# Patient Record
Sex: Male | Born: 1980 | Race: White | Hispanic: No | Marital: Married | Smoking: Never smoker
Health system: Southern US, Community
[De-identification: ages and names within clinical notes are randomized; demographics above are authoritative.]

---

## 2005-08-14 ENCOUNTER — Emergency Department: Payer: Self-pay | Admitting: Emergency Medicine

## 2014-06-20 ENCOUNTER — Emergency Department: Payer: Self-pay | Admitting: Emergency Medicine

## 2016-02-19 IMAGING — CR RIGHT RING FINGER 2+V
1 series · 3 of 3 positions shown · non-contrast
Comparison: None.

CLINICAL DATA: Laceration to the base of the right ring finger.
Tripped and fall injury.

EXAM:
RIGHT RING FINGER 2+V

[Series 1: dxr finger ring 4th digit rt han · 0.14mm/px · 3 of 3 slices shown]
[im 1/3]
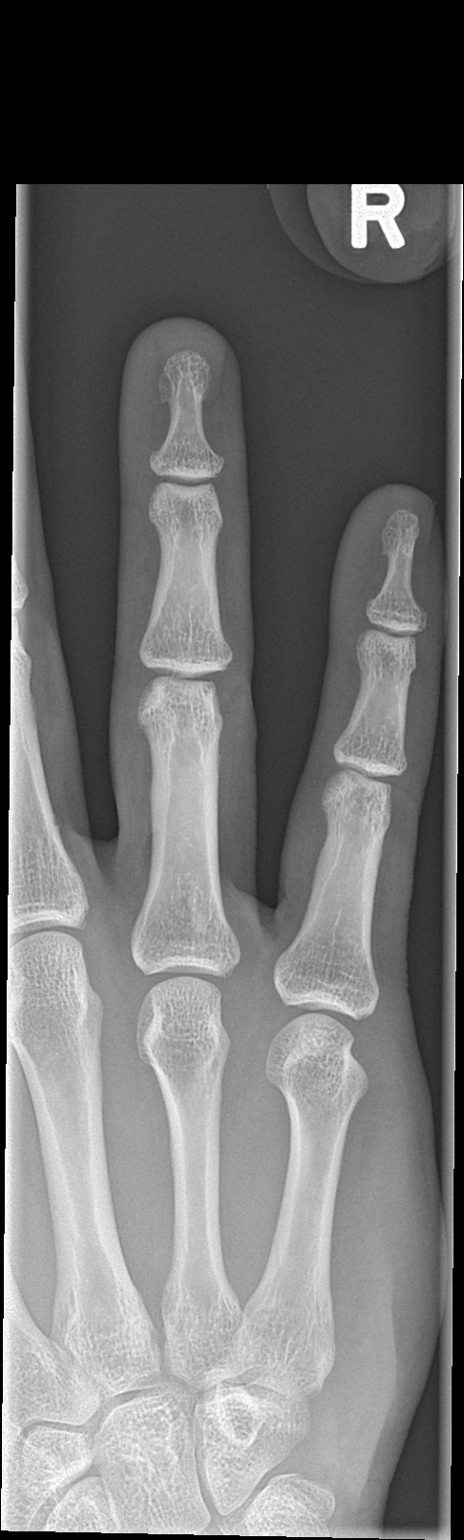
[im 2/3]
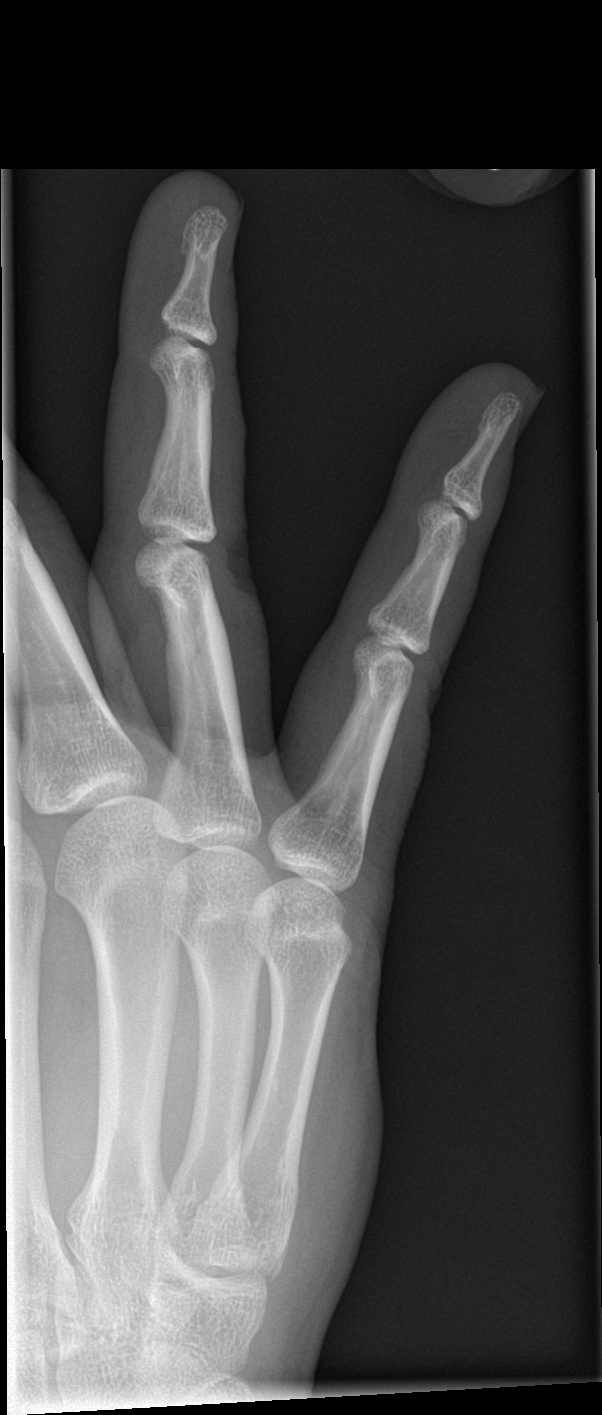
[im 3/3]
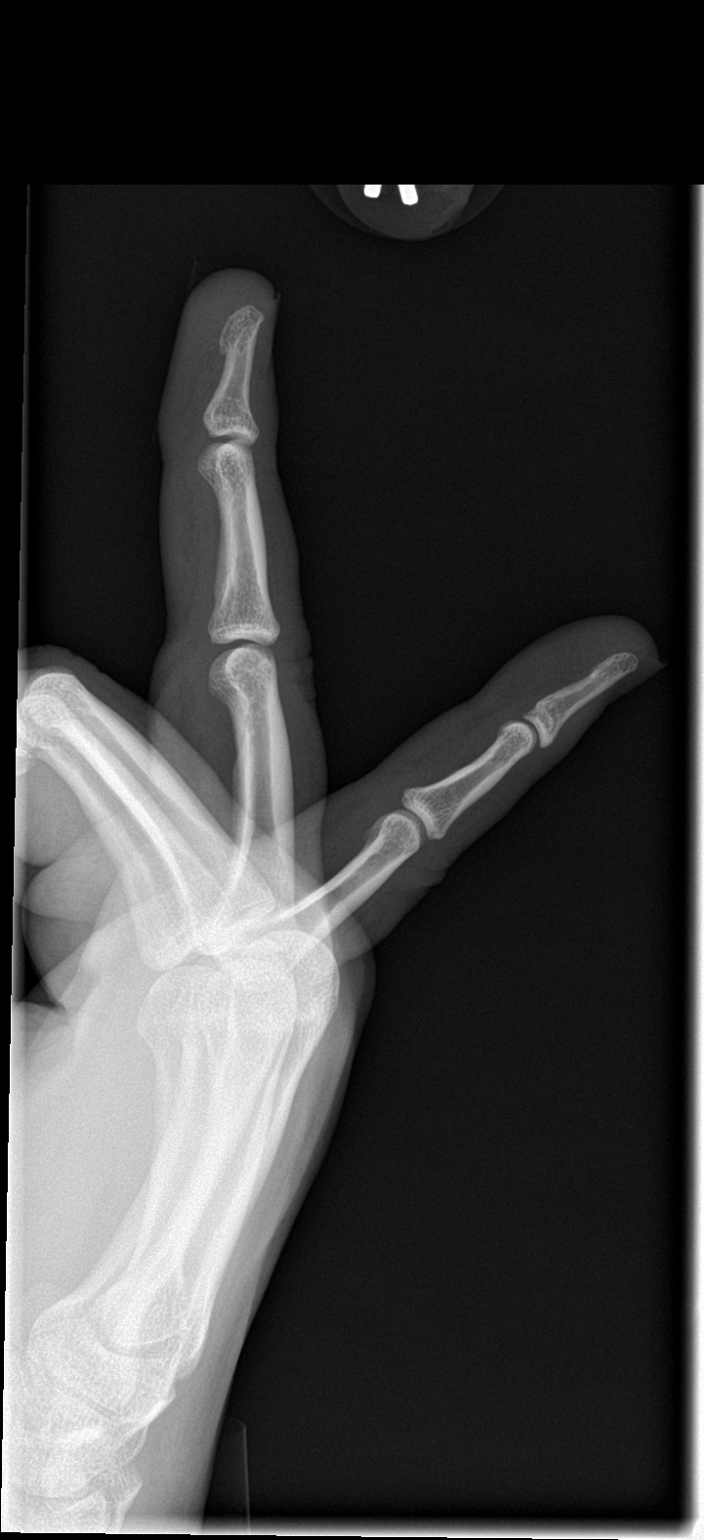

[3 of 3 positions shown; findings below may reference images not displayed]

FINDINGS: There is no evidence of fracture or dislocation. There is no
evidence of arthropathy or other focal bone abnormality. Soft
tissues are unremarkable. No radiopaque soft tissue foreign bodies
identified.
IMPRESSION: Negative.

## 2018-05-16 ENCOUNTER — Ambulatory Visit
Admission: RE | Admit: 2018-05-16 | Discharge: 2018-05-16 | Disposition: A | Discharge status: Home / Self Care | Payer: No Typology Code available for payment source | Source: Ambulatory Visit | Attending: Interventional Radiology | Admitting: Interventional Radiology

## 2018-05-16 ENCOUNTER — Other Ambulatory Visit: Payer: Self-pay | Admitting: Physician Assistant

## 2018-05-16 DIAGNOSIS — X58XXXA Exposure to other specified factors, initial encounter: Secondary | ICD-10-CM | POA: Insufficient documentation

## 2018-05-16 DIAGNOSIS — S0003XA Contusion of scalp, initial encounter: Secondary | ICD-10-CM | POA: Insufficient documentation

## 2019-03-29 ENCOUNTER — Telehealth: Payer: Self-pay

## 2019-03-29 NOTE — Telephone Encounter (Signed)
In the last 24 hours have you experienced any of these symptoms . Difficulty breathing - No . Nasal congestion - No . New cough - No . Runny nose - Yes, states "I deal with that all the time". . Shortness of breath - No . New sore throat - No . Unexplained body aches - No . Nausea or vomiting - Nausea . Diarrhea - No . Loss of taste or smell - No . Fever (temp>100 F/37.8 C) or chills - No (Last checked last night & states no fever)  Exposure:  . Have you been in close contact with someone with confirmed diagnosis of COVID or person under going testing in past 14 days? - No  . Have you been tested for COVID? If yes date, location, results in known - No  . Have you been living in the same home as a person with confirmed diagnosis of COVID or a person undergoing testing? (household contact) - No  . Have you been diagnosed with COVID or are you waiting on test results? - No  . Have you traveled anywhere in the past 4 weeks? If yes where - !st week of August went to [facility]  Nausea & Diarrhea started last night.  Has been to the bathroom 5-6 times.   No OTC medications have been taken. States I'm starting to feel a little bit better - has only had 2 Bowel movements today & stool is loose.  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  Next scheduled work day is tomorrow. Today was the 1st day of Friday - Tuesday rotation.  AMD

## 2019-04-30 ENCOUNTER — Ambulatory Visit: Payer: Self-pay

## 2019-04-30 DIAGNOSIS — Z23 Encounter for immunization: Secondary | ICD-10-CM

## 2019-07-17 ENCOUNTER — Other Ambulatory Visit: Payer: Self-pay

## 2019-07-17 DIAGNOSIS — M1A09X0 Idiopathic chronic gout, multiple sites, without tophus (tophi): Secondary | ICD-10-CM

## 2019-07-19 MED ORDER — ETODOLAC 500 MG PO TABS: ORAL_TABLET | ORAL | 3 refills | Status: DC

## 2019-07-31 ENCOUNTER — Other Ambulatory Visit: Payer: Self-pay

## 2019-07-31 ENCOUNTER — Ambulatory Visit: Payer: 59

## 2019-07-31 DIAGNOSIS — Z0000 Encounter for general adult medical examination without abnormal findings: Secondary | ICD-10-CM

## 2019-07-31 LAB — POCT URINALYSIS DIPSTICK
Bilirubin, UA: NEGATIVE
Blood, UA: NEGATIVE
Glucose, UA: NEGATIVE
Ketones, UA: NEGATIVE
Leukocytes, UA: NEGATIVE
Nitrite, UA: NEGATIVE
Protein, UA: NEGATIVE
Spec Grav, UA: 1.01 (ref 1.010–1.025)
Urobilinogen, UA: 0.2 E.U./dL
pH, UA: 6 (ref 5.0–8.0)

## 2019-07-31 NOTE — Addendum Note (Signed)
Addended by: Thurmond Butts on: 07/31/2019 02:26 PM   Modules accepted: Orders

## 2019-07-31 NOTE — Progress Notes (Signed)
Lab and EKG visit. Annual Physical scheduled on 08/08/19 with Laurey Morale, PA

## 2019-08-01 LAB — CMP12+LP+TP+TSH+6AC+PSA+CBC…
ALT: 52 IU/L — ABNORMAL HIGH (ref 0–44)
Albumin: 4.7 g/dL (ref 4.0–5.0)
Basophils Absolute: 0 10*3/uL (ref 0.0–0.2)
Basos: 1 %
Bilirubin Total: 1 mg/dL (ref 0.0–1.2)
Calcium: 9.7 mg/dL (ref 8.7–10.2)
Chloride: 103 mmol/L (ref 96–106)
Chol/HDL Ratio: 4.7 ratio (ref 0.0–5.0)
Creatinine, Ser: 1.15 mg/dL (ref 0.76–1.27)
GFR calc Af Amer: 93 mL/min/{1.73_m2} (ref >59)
GFR calc non Af Amer: 81 mL/min/{1.73_m2} (ref >59)
Hematocrit: 43.8 % (ref 37.5–51.0)
Lymphocytes Absolute: 1.9 10*3/uL (ref 0.7–3.1)
MCH: 33.5 pg — ABNORMAL HIGH (ref 26.6–33.0)
MCHC: 35.2 g/dL (ref 31.5–35.7)
Monocytes Absolute: 0.4 10*3/uL (ref 0.1–0.9)
Neutrophils: 57 %
Phosphorus: 4.2 mg/dL — ABNORMAL HIGH (ref 2.8–4.1)
Platelets: 148 10*3/uL — ABNORMAL LOW (ref 150–450)
RBC: 4.6 x10E6/uL (ref 4.14–5.80)
Sodium: 143 mmol/L (ref 134–144)
Total Protein: 7 g/dL (ref 6.0–8.5)
Triglycerides: 190 mg/dL — ABNORMAL HIGH (ref 0–149)
VLDL Cholesterol Cal: 33 mg/dL (ref 5–40)

## 2019-08-01 LAB — CMP12+LP+TP+TSH+6AC+PSA+CBC?
AST: 39 IU/L (ref 0–40)
Albumin/Globulin Ratio: 2 (ref 1.2–2.2)
Alkaline Phosphatase: 61 IU/L (ref 39–117)
BUN/Creatinine Ratio: 12 (ref 9–20)
BUN: 14 mg/dL (ref 6–20)
Cholesterol, Total: 184 mg/dL (ref 100–199)
EOS (ABSOLUTE): 0.1 10*3/uL (ref 0.0–0.4)
Eos: 2 %
Estimated CHD Risk: 1 times avg. (ref 0.0–1.0)
Free Thyroxine Index: 1.8 (ref 1.2–4.9)
GGT: 26 IU/L (ref 0–65)
Globulin, Total: 2.3 g/dL (ref 1.5–4.5)
Glucose: 82 mg/dL (ref 65–99)
HDL: 39 mg/dL — ABNORMAL LOW (ref >39)
Hemoglobin: 15.4 g/dL (ref 13.0–17.7)
Immature Grans (Abs): 0 10*3/uL (ref 0.0–0.1)
Immature Granulocytes: 0 %
Iron: 110 ug/dL (ref 38–169)
LDH: 252 IU/L — ABNORMAL HIGH (ref 121–224)
LDL Chol Calc (NIH): 112 mg/dL — ABNORMAL HIGH (ref 0–99)
Lymphs: 33 %
MCV: 95 fL (ref 79–97)
Monocytes: 7 %
Neutrophils Absolute: 3.2 10*3/uL (ref 1.4–7.0)
Potassium: 4.6 mmol/L (ref 3.5–5.2)
Prostate Specific Ag, Serum: 0.8 ng/mL (ref 0.0–4.0)
RDW: 13.5 % (ref 11.6–15.4)
T3 Uptake Ratio: 28 % (ref 24–39)
T4, Total: 6.5 ug/dL (ref 4.5–12.0)
TSH: 1.61 u[IU]/mL (ref 0.450–4.500)
Uric Acid: 8.6 mg/dL — ABNORMAL HIGH (ref 3.8–8.4)
WBC: 5.6 10*3/uL (ref 3.4–10.8)

## 2019-08-08 ENCOUNTER — Other Ambulatory Visit: Payer: Self-pay

## 2019-08-08 ENCOUNTER — Ambulatory Visit: Payer: 59 | Admitting: Physician Assistant

## 2019-08-08 ENCOUNTER — Encounter: Payer: Self-pay | Admitting: Physician Assistant

## 2019-08-08 VITALS — BP 122/80 | HR 88 | Temp 98.5°F | Resp 12 | Ht 74.0 in | Wt 234.0 lb

## 2019-08-08 DIAGNOSIS — K219 Gastro-esophageal reflux disease without esophagitis: Secondary | ICD-10-CM

## 2019-08-08 DIAGNOSIS — Z0000 Encounter for general adult medical examination without abnormal findings: Secondary | ICD-10-CM

## 2019-08-08 DIAGNOSIS — E663 Overweight: Secondary | ICD-10-CM

## 2019-08-08 DIAGNOSIS — E786 Lipoprotein deficiency: Secondary | ICD-10-CM

## 2019-08-08 DIAGNOSIS — L500 Allergic urticaria: Secondary | ICD-10-CM

## 2019-08-08 DIAGNOSIS — M109 Gout, unspecified: Secondary | ICD-10-CM | POA: Insufficient documentation

## 2019-08-08 NOTE — Patient Instructions (Signed)

## 2019-08-08 NOTE — Progress Notes (Signed)
Went to dermatologist on Monday & skin biopsy collected from spot on back & sent off for testing.

## 2019-08-08 NOTE — Progress Notes (Addendum)
   Subjective:    Patient ID: Johnny Ball, male    DOB: [birth year], 39 y.o.   MRN: [id number]  HPI  39 yo M for routine annual visit Generally doing well Just started working out after the Holiday - abs are tender! A bit anxious about a biopsy taken upper left back by Dermatology- only 2 days ago- path still pending  Hx of gout- Has had a few episodes over past 6 months Understands the beef/beer/high fructose/dehydaration concept Remember to hydrate with water when" guys night grilling"   Review of Systems Lesion path pending  left upper back Lesions left chest in tattoo with asymmetrical coloring-followed by Derm Hx GERD- currently doing well Seasonal allergies/allergic urticaria- Fexofenadine controls    Objective:   Physical Exam Constitutional:      General: He is not in acute distress.    Appearance: Normal appearance.     Comments: overweight  HENT:     Head: Normocephalic and atraumatic.     Right Ear: Tympanic membrane normal.     Left Ear: Tympanic membrane normal.     Mouth/Throat:     Mouth: Mucous membranes are moist.  Eyes:     Extraocular Movements: Extraocular movements intact.  Cardiovascular:     Rate and Rhythm: Normal rate and regular rhythm.  Pulmonary:     Effort: Pulmonary effort is normal.     Breath sounds: Normal breath sounds.  Abdominal:     General: Bowel sounds are normal.     Palpations: There is no mass.     Tenderness: There is no abdominal tenderness.  Genitourinary:    Comments: deferred Musculoskeletal:        General: No tenderness. Normal range of motion.     Cervical back: Normal range of motion and neck supple.     Comments: Mild abdominal wall tenderness -new exercise program  Skin:    General: Skin is warm and dry.     Capillary Refill: Capillary refill takes less than 2 seconds.     Comments: Sun damage longstanding-Derm follows  Biopsy results pending left back  Neurological:     Mental Status: He is alert.   Comments: Interactive, pleasant and appropriate  Psychiatric:        Mood and Affect: Mood normal.        Behavior: Behavior normal.       Assessment & Plan:  Informational handout given after teaching for low purine /Gout avoidance  Goals: reduce weight /increase HDL Brisk walk daily 30 minutes Daily reduction 500 calories for steady weight loss encouraged- increased dietary awareness   Patient called to report that Derm biopsy reported as negative

## 2019-11-19 NOTE — Progress Notes (Signed)
Completed annual Physical with Anette Riedel, PA-C on 08/08/19 & she requested he have a 3 month follow-up CMP.  Scheduled to review lab results 11/27/19 with Durward Parcel, PA-C.  C/O knees bothering him - thinks it's gout.  No longer takes preventive gout medication.  Changed follow-up lab to an EX1 panel so it would check uric acid level (uric acid not in a CMP). Patient C/O of possible gout flare in his knees.  Said he's been prescribed Allopurinol in the past but said "it makes me feel funny".  [place] Spaniel he has some etodolac that he can take. Offered to let him see the provider in the office today & he said he can wait till his appointment next week. Tart cherry juice was also recommended to him.  Increase water intake. Discussed foods/drinks that can contribute to a gout flare.  Verbalized understanding.  AMD

## 2019-11-20 ENCOUNTER — Other Ambulatory Visit: Payer: Self-pay

## 2019-11-20 DIAGNOSIS — M109 Gout, unspecified: Secondary | ICD-10-CM

## 2019-11-20 DIAGNOSIS — E786 Lipoprotein deficiency: Secondary | ICD-10-CM

## 2019-11-21 LAB — CMP12+LP+TP+TSH+6AC+CBC/D/PLT
ALT: 24 IU/L (ref 0–44)
AST: 24 IU/L (ref 0–40)
Albumin/Globulin Ratio: 2.1 (ref 1.2–2.2)
Albumin: 4.6 g/dL (ref 4.0–5.0)
Alkaline Phosphatase: 72 IU/L (ref 39–117)
BUN/Creatinine Ratio: 11 (ref 9–20)
BUN: 13 mg/dL (ref 6–20)
Basophils Absolute: 0 10*3/uL (ref 0.0–0.2)
Basos: 1 %
Bilirubin Total: 0.8 mg/dL (ref 0.0–1.2)
Calcium: 9.4 mg/dL (ref 8.7–10.2)
Chloride: 104 mmol/L (ref 96–106)
Chol/HDL Ratio: 4.5 ratio (ref 0.0–5.0)
Cholesterol, Total: 170 mg/dL (ref 100–199)
Creatinine, Ser: 1.14 mg/dL (ref 0.76–1.27)
EOS (ABSOLUTE): 0.1 10*3/uL (ref 0.0–0.4)
Eos: 2 %
Estimated CHD Risk: 0.9 times avg. (ref 0.0–1.0)
Free Thyroxine Index: 1.7 (ref 1.2–4.9)
GFR calc Af Amer: 94 mL/min/{1.73_m2} (ref >59)
GFR calc non Af Amer: 82 mL/min/{1.73_m2} (ref >59)
GGT: 20 IU/L (ref 0–65)
Globulin, Total: 2.2 g/dL (ref 1.5–4.5)
Glucose: 89 mg/dL (ref 65–99)
HDL: 38 mg/dL — ABNORMAL LOW (ref >39)
Hematocrit: 44.1 % (ref 37.5–51.0)
Hemoglobin: 15.4 g/dL (ref 13.0–17.7)
Immature Grans (Abs): 0 10*3/uL (ref 0.0–0.1)
Immature Granulocytes: 0 %
Iron: 70 ug/dL (ref 38–169)
LDH: 205 IU/L (ref 121–224)
LDL Chol Calc (NIH): 108 mg/dL — ABNORMAL HIGH (ref 0–99)
Lymphocytes Absolute: 1.9 10*3/uL (ref 0.7–3.1)
Lymphs: 32 %
MCH: 32.8 pg (ref 26.6–33.0)
MCHC: 34.9 g/dL (ref 31.5–35.7)
MCV: 94 fL (ref 79–97)
Monocytes Absolute: 0.3 10*3/uL (ref 0.1–0.9)
Monocytes: 6 %
Neutrophils Absolute: 3.6 10*3/uL (ref 1.4–7.0)
Neutrophils: 59 %
Phosphorus: 4.2 mg/dL — ABNORMAL HIGH (ref 2.8–4.1)
Platelets: 168 10*3/uL (ref 150–450)
Potassium: 4.3 mmol/L (ref 3.5–5.2)
RBC: 4.69 x10E6/uL (ref 4.14–5.80)
RDW: 14.1 % (ref 11.6–15.4)
Sodium: 139 mmol/L (ref 134–144)
T3 Uptake Ratio: 26 % (ref 24–39)
T4, Total: 6.4 ug/dL (ref 4.5–12.0)
TSH: 1.7 u[IU]/mL (ref 0.450–4.500)
Total Protein: 6.8 g/dL (ref 6.0–8.5)
Triglycerides: 135 mg/dL (ref 0–149)
Uric Acid: 6.8 mg/dL (ref 3.8–8.4)
VLDL Cholesterol Cal: 24 mg/dL (ref 5–40)
WBC: 6 10*3/uL (ref 3.4–10.8)

## 2019-11-27 ENCOUNTER — Other Ambulatory Visit: Payer: Self-pay

## 2019-11-27 ENCOUNTER — Encounter: Payer: Self-pay | Admitting: Emergency Medicine

## 2019-11-27 ENCOUNTER — Ambulatory Visit: Payer: 59 | Admitting: Emergency Medicine

## 2019-11-27 VITALS — BP 125/85 | HR 88 | Temp 99.1°F | Resp 12

## 2019-11-27 DIAGNOSIS — Z0000 Encounter for general adult medical examination without abnormal findings: Secondary | ICD-10-CM

## 2019-11-27 MED ORDER — COLCHICINE 0.6 MG PO TABS: 0.6000 mg | ORAL_TABLET | Freq: Every day | ORAL | 1 refills | Status: DC

## 2019-11-27 MED ORDER — ALLOPURINOL 100 MG PO TABS: 100.0000 mg | ORAL_TABLET | Freq: Every day | ORAL | 6 refills | Status: DC

## 2019-11-27 NOTE — Progress Notes (Signed)
Follow-up to 08/08/19 office visit (Physical) with Anette Riedel, PA-C.  CMP collected on 11/20/19 - liver enzymes elevated at physical  Over 5 years ago took Allopurinol for gout prevention. Stopped taking because he was afraid it would cause kidney damage.  Previously only 2-3 flare-up of gout/year, but not he's experiencing them more frequently.   AMD

## 2019-11-27 NOTE — Progress Notes (Signed)
   Subjective: Follow up, right knee pain    Patient ID: Tymeir Weathington, male    DOB: 08/12/1980, 39 y.o.   MRN: [id number]  HPI Patient presents for follow up from previous visit. Still having gout flare ups. Has gained some weight and is working out less. Is inquiring about allopurinol for gout.   Review of Systems Positive for right knee and right foot pain on occasion    Objective:   Physical Exam Gen: Alert, NAD HEENT: normal CV: regular rate and rhythm, normal S1, S2 Pulm: Lungs CTA bilat Abd: nontender Ext: mild effusion of right knee is noted. Unremarkable range of motion Neuro: CN's intact, normal gait       Assessment & Plan: Reevaluation, Gout  We discussed allopurinol. Will restart this medication for him. Labs recently checked were unremarkable. Follow up as needed for worsening gout.

## 2020-05-27 NOTE — Progress Notes (Signed)
Pt scheduled to complete physical with Ron Smith, PA-C.  CL,RMA 

## 2020-05-28 ENCOUNTER — Other Ambulatory Visit: Payer: Self-pay

## 2020-05-28 ENCOUNTER — Ambulatory Visit: Payer: Self-pay

## 2020-05-28 DIAGNOSIS — Z0000 Encounter for general adult medical examination without abnormal findings: Secondary | ICD-10-CM

## 2020-05-28 LAB — POCT URINALYSIS DIPSTICK
Bilirubin, UA: NEGATIVE
Blood, UA: NEGATIVE
Glucose, UA: POSITIVE — AB
Ketones, UA: NEGATIVE
Leukocytes, UA: NEGATIVE
Nitrite, UA: NEGATIVE
Protein, UA: NEGATIVE
Spec Grav, UA: 1.02 (ref 1.010–1.025)
Urobilinogen, UA: 0.2 E.U./dL
pH, UA: 5.5 (ref 5.0–8.0)

## 2020-05-29 LAB — CMP12+LP+TP+TSH+6AC+CBC/D/PLT
ALT: 27 IU/L (ref 0–44)
AST: 21 IU/L (ref 0–40)
Albumin/Globulin Ratio: 2.6 — ABNORMAL HIGH (ref 1.2–2.2)
Albumin: 5.1 g/dL — ABNORMAL HIGH (ref 4.0–5.0)
Alkaline Phosphatase: 67 IU/L (ref 44–121)
BUN/Creatinine Ratio: 15 (ref 9–20)
BUN: 19 mg/dL (ref 6–20)
Basophils Absolute: 0 10*3/uL (ref 0.0–0.2)
Basos: 0 %
Bilirubin Total: 1.4 mg/dL — ABNORMAL HIGH (ref 0.0–1.2)
Calcium: 9.3 mg/dL (ref 8.7–10.2)
Chloride: 104 mmol/L (ref 96–106)
Chol/HDL Ratio: 3.8 ratio (ref 0.0–5.0)
Cholesterol, Total: 182 mg/dL (ref 100–199)
Creatinine, Ser: 1.25 mg/dL (ref 0.76–1.27)
EOS (ABSOLUTE): 0.1 10*3/uL (ref 0.0–0.4)
Eos: 1 %
Estimated CHD Risk: 0.7 times avg. (ref 0.0–1.0)
Free Thyroxine Index: 1.3 (ref 1.2–4.9)
GFR calc Af Amer: 84 mL/min/{1.73_m2} (ref >59)
GFR calc non Af Amer: 73 mL/min/{1.73_m2} (ref >59)
GGT: 19 IU/L (ref 0–65)
Globulin, Total: 2 g/dL (ref 1.5–4.5)
Glucose: 88 mg/dL (ref 65–99)
HDL: 48 mg/dL (ref >39)
Hematocrit: 45.3 % (ref 37.5–51.0)
Hemoglobin: 16.3 g/dL (ref 13.0–17.7)
Immature Grans (Abs): 0 10*3/uL (ref 0.0–0.1)
Immature Granulocytes: 0 %
Iron: 139 ug/dL (ref 38–169)
LDH: 228 IU/L — ABNORMAL HIGH (ref 121–224)
LDL Chol Calc (NIH): 113 mg/dL — ABNORMAL HIGH (ref 0–99)
Lymphocytes Absolute: 1.7 10*3/uL (ref 0.7–3.1)
Lymphs: 28 %
MCH: 33.5 pg — ABNORMAL HIGH (ref 26.6–33.0)
MCHC: 36 g/dL — ABNORMAL HIGH (ref 31.5–35.7)
MCV: 93 fL (ref 79–97)
Monocytes Absolute: 0.4 10*3/uL (ref 0.1–0.9)
Monocytes: 6 %
Neutrophils Absolute: 3.9 10*3/uL (ref 1.4–7.0)
Neutrophils: 65 %
Phosphorus: 4 mg/dL (ref 2.8–4.1)
Platelets: 143 10*3/uL — ABNORMAL LOW (ref 150–450)
Potassium: 4.5 mmol/L (ref 3.5–5.2)
RBC: 4.87 x10E6/uL (ref 4.14–5.80)
RDW: 14.3 % (ref 11.6–15.4)
Sodium: 142 mmol/L (ref 134–144)
T3 Uptake Ratio: 25 % (ref 24–39)
T4, Total: 5.3 ug/dL (ref 4.5–12.0)
TSH: 1.3 u[IU]/mL (ref 0.450–4.500)
Total Protein: 7.1 g/dL (ref 6.0–8.5)
Triglycerides: 116 mg/dL (ref 0–149)
Uric Acid: 7.5 mg/dL (ref 3.8–8.4)
VLDL Cholesterol Cal: 21 mg/dL (ref 5–40)
WBC: 6.1 10*3/uL (ref 3.4–10.8)

## 2020-06-03 ENCOUNTER — Encounter: Payer: Self-pay | Admitting: Nurse Practitioner

## 2020-06-03 ENCOUNTER — Ambulatory Visit: Payer: Self-pay | Admitting: Nurse Practitioner

## 2020-06-03 ENCOUNTER — Other Ambulatory Visit: Payer: Self-pay

## 2020-06-03 VITALS — BP 130/90 | HR 71 | Temp 97.6°F | Resp 16 | Ht 75.0 in | Wt 224.0 lb

## 2020-06-03 DIAGNOSIS — M109 Gout, unspecified: Secondary | ICD-10-CM

## 2020-06-03 DIAGNOSIS — R81 Glycosuria: Secondary | ICD-10-CM

## 2020-06-03 LAB — POCT URINALYSIS DIPSTICK
Bilirubin, UA: NEGATIVE
Blood, UA: NEGATIVE
Glucose, UA: NEGATIVE
Ketones, UA: NEGATIVE
Leukocytes, UA: NEGATIVE
Nitrite, UA: NEGATIVE
Protein, UA: NEGATIVE
Spec Grav, UA: 1.025 (ref 1.010–1.025)
Urobilinogen, UA: 0.2 E.U./dL
pH, UA: 5.5 (ref 5.0–8.0)

## 2020-06-03 MED ORDER — ALLOPURINOL 100 MG PO TABS: 100.0000 mg | ORAL_TABLET | Freq: Every day | ORAL | 6 refills | Status: DC

## 2020-06-03 NOTE — Progress Notes (Signed)
Pt presents today to complete physical with sarah parker,FNP

## 2020-06-03 NOTE — Progress Notes (Signed)
Subjective:    Patient ID: Johnny Ball, male    DOB: 1981-02-06, 39 y.o.   MRN: [id number]  HPI  39 year old male here for annual physical for BPD. No complaints today. Gout has been well controlled since restarting Allopurinol in April.  He had taken Allopurinol in his early 73s and then stopped when symptoms resolved. Labs done 48months ago represent labs prior to starting medication. Continues to use colchicine for symptom control as well.    Is trying to control his diet more and exercise.   Uses generic OTC antihistamine for allergy control.   Normal EKG performed 2020.   Today's Vitals   06/03/20 1122  BP: 130/90  Pulse: 71  Resp: 16  Temp: 97.6 F (36.4 C)  SpO2: 100%  Weight: 224 lb (101.6 kg)  Height: $Remove'6\' 3"'RJybFjO$  (1.905 m)   Body mass index is 28 kg/m.  Review of Systems  Constitutional: Negative.   HENT: Negative.   Eyes: Negative.   Respiratory: Negative.   Cardiovascular: Negative.   Gastrointestinal: Negative.   Endocrine: Negative.   Genitourinary: Negative.   Musculoskeletal: Negative.   Allergic/Immunologic: Negative.   Neurological: Negative.   Hematological: Negative.   Psychiatric/Behavioral: Negative.    Current Outpatient Medications  Medication Instructions  . allopurinol (ZYLOPRIM) 100 mg, Oral, Daily  . colchicine 0.6 mg, Oral, Daily, To take for gout flare up if occurs. Do not take more than 2 per day  . etodolac (LODINE) 500 MG tablet 1 tab po bid qd prn  . KLS ALLER-FEX 180 MG tablet No dose, route, or frequency recorded.     Objective:   Physical Exam Constitutional:      Appearance: Normal appearance.  HENT:     Head: Normocephalic.     Right Ear: Tympanic membrane, ear canal and external ear normal.     Left Ear: Tympanic membrane, ear canal and external ear normal.     Nose: Nose normal.     Mouth/Throat:     Mouth: Mucous membranes are moist.     Pharynx: Oropharynx is clear.  Eyes:     Extraocular Movements: Extraocular  movements intact.     Conjunctiva/sclera: Conjunctivae normal.     Pupils: Pupils are equal, round, and reactive to light.  Cardiovascular:     Rate and Rhythm: Normal rate and regular rhythm.     Pulses: Normal pulses.  Pulmonary:     Effort: Pulmonary effort is normal.     Breath sounds: Normal breath sounds.  Abdominal:     General: Bowel sounds are normal.     Palpations: Abdomen is soft.     Tenderness: There is no abdominal tenderness.  Musculoskeletal:        General: Normal range of motion.     Cervical back: Normal range of motion and neck supple.  Skin:    General: Skin is warm and dry.     Capillary Refill: Capillary refill takes less than 2 seconds.  Neurological:     General: No focal deficit present.     Mental Status: He is alert and oriented to person, place, and time.     Cranial Nerves: No cranial nerve deficit.     Motor: No weakness.     Gait: Gait normal.  Psychiatric:        Mood and Affect: Mood normal.        Behavior: Behavior normal.        Thought Content: Thought content normal.  Judgment: Judgment normal.    Recent Results (from the past 2160 hour(s))  CMP12+LP+TP+TSH+6AC+CBC/D/Plt     Status: Abnormal   Collection Time: 05/28/20  3:01 PM  Result Value Ref Range   Glucose 88 65 - 99 mg/dL   Uric Acid 7.5 3.8 - 8.4 mg/dL    Comment:            Therapeutic target for gout patients: <6.0   BUN 19 6 - 20 mg/dL   Creatinine, Ser 1.25 0.76 - 1.27 mg/dL   GFR calc non Af Amer 73 >59 mL/min/1.73   GFR calc Af Amer 84 >59 mL/min/1.73    Comment: **In accordance with recommendations from the NKF-ASN Task force,**   [facility] is in the process of updating its eGFR calculation to the   2021 CKD-EPI creatinine equation that estimates kidney function   without a race variable.    BUN/Creatinine Ratio 15 9 - 20   Sodium 142 134 - 144 mmol/L   Potassium 4.5 3.5 - 5.2 mmol/L   Chloride 104 96 - 106 mmol/L   Calcium 9.3 8.7 - 10.2 mg/dL   Phosphorus  4.0 2.8 - 4.1 mg/dL   Total Protein 7.1 6.0 - 8.5 g/dL   Albumin 5.1 (H) 4.0 - 5.0 g/dL   Globulin, Total 2.0 1.5 - 4.5 g/dL   Albumin/Globulin Ratio 2.6 (H) 1.2 - 2.2   Bilirubin Total 1.4 (H) 0.0 - 1.2 mg/dL   Alkaline Phosphatase 67 44 - 121 IU/L    Comment:               **Please note reference interval change**   LDH 228 (H) 121 - 224 IU/L   AST 21 0 - 40 IU/L   ALT 27 0 - 44 IU/L   GGT 19 0 - 65 IU/L   Iron 139 38 - 169 ug/dL   Cholesterol, Total 182 100 - 199 mg/dL   Triglycerides 116 0 - 149 mg/dL   HDL 48 >39 mg/dL   VLDL Cholesterol Cal 21 5 - 40 mg/dL   LDL Chol Calc (NIH) 113 (H) 0 - 99 mg/dL   Chol/HDL Ratio 3.8 0.0 - 5.0 ratio    Comment:                                   T. Chol/HDL Ratio                                             Men  Women                               1/2 Avg.Risk  3.4    3.3                                   Avg.Risk  5.0    4.4                                2X Avg.Risk  9.6    7.1  3X Avg.Risk 23.4   11.0    Estimated CHD Risk 0.7 0.0 - 1.0 times avg.    Comment: The CHD Risk is based on the T. Chol/HDL ratio. Other factors affect CHD Risk such as hypertension, smoking, diabetes, severe obesity, and family history of premature CHD.    TSH 1.300 0.450 - 4.500 uIU/mL   T4, Total 5.3 4.5 - 12.0 ug/dL   T3 Uptake Ratio 25 24 - 39 %   Free Thyroxine Index 1.3 1.2 - 4.9   WBC 6.1 3.4 - 10.8 x10E3/uL   RBC 4.87 4.14 - 5.80 x10E6/uL   Hemoglobin 16.3 13.0 - 17.7 g/dL   Hematocrit 45.3 37.5 - 51.0 %   MCV 93 79 - 97 fL   MCH 33.5 (H) 26.6 - 33.0 pg   MCHC 36.0 (H) 31 - 35 g/dL   RDW 14.3 11.6 - 15.4 %   Platelets 143 (L) 150 - 450 x10E3/uL   Neutrophils 65 Not Estab. %   Lymphs 28 Not Estab. %   Monocytes 6 Not Estab. %   Eos 1 Not Estab. %   Basos 0 Not Estab. %   Neutrophils Absolute 3.9 1.40 - 7.00 x10E3/uL   Lymphocytes Absolute 1.7 0 - 3 x10E3/uL   Monocytes Absolute 0.4 0 - 0 x10E3/uL   EOS  (ABSOLUTE) 0.1 0.0 - 0.4 x10E3/uL   Basophils Absolute 0.0 0 - 0 x10E3/uL   Immature Granulocytes 0 Not Estab. %   Immature Grans (Abs) 0.0 0.0 - 0.1 x10E3/uL  POCT urinalysis dipstick     Status: Abnormal   Collection Time: 05/28/20  3:49 PM  Result Value Ref Range   Color, UA yellow    Clarity, UA clear    Glucose, UA Positive (A) Negative    Comment: 100   Bilirubin, UA negative    Ketones, UA negative    Spec Grav, UA 1.020 1.010 - 1.025   Blood, UA negative    pH, UA 5.5 5.0 - 8.0   Protein, UA Negative Negative   Urobilinogen, UA 0.2 0.2 or 1.0 E.U./dL   Nitrite, UA negative    Leukocytes, UA Negative Negative   Appearance medium yellow    Odor    POCT urinalysis dipstick     Status: Normal   Collection Time: 06/03/20 11:43 AM  Result Value Ref Range   Color, UA yellow    Clarity, UA clear    Glucose, UA Negative Negative   Bilirubin, UA negative    Ketones, UA negative    Spec Grav, UA 1.025 1.010 - 1.025   Blood, UA negative    pH, UA 5.5 5.0 - 8.0   Protein, UA Negative Negative   Urobilinogen, UA 0.2 0.2 or 1.0 E.U./dL   Nitrite, UA negative    Leukocytes, UA Negative Negative   Appearance dark    Odor     LDH has decreased in the past 10 months from 252 to 228 current.  Total cholesterol stable at 182, LDL stable at 113 from 112 at the time of last year's physical. Uric Acid has returned to normal levels  Urine dip repeated today due to glucose that was noted at previous time of lab draw, urine today WNL.       Assessment & Plan:  1. Continue allopurinol will refill today 2. Continue to focus on diet and exercise as discussed.  3. Annual eye and 6 month dental exams as scheduled. 4. EKG at next annual as was not done  this year, normal in the past  5. RTC with any new concerns prior to next year's physical time Encouraged flu vaccine   Meds ordered this encounter  Medications  . allopurinol (ZYLOPRIM) 100 MG tablet    Sig: Take 1 tablet (100 mg  total) by mouth daily.    Dispense:  30 tablet    Refill:  6

## 2020-08-04 ENCOUNTER — Other Ambulatory Visit: Payer: Self-pay

## 2020-08-04 DIAGNOSIS — Z1152 Encounter for screening for COVID-19: Secondary | ICD-10-CM

## 2020-08-06 LAB — NOVEL CORONAVIRUS, NAA: SARS-CoV-2, NAA: NOT DETECTED

## 2020-08-06 LAB — SARS-COV-2, NAA 2 DAY TAT

## 2021-03-14 ENCOUNTER — Other Ambulatory Visit: Payer: Self-pay | Admitting: Nurse Practitioner

## 2021-03-14 DIAGNOSIS — M109 Gout, unspecified: Secondary | ICD-10-CM

## 2021-03-22 ENCOUNTER — Ambulatory Visit: Payer: Self-pay

## 2021-03-22 ENCOUNTER — Other Ambulatory Visit: Payer: Self-pay

## 2021-03-22 DIAGNOSIS — Z0000 Encounter for general adult medical examination without abnormal findings: Secondary | ICD-10-CM

## 2021-03-22 LAB — POCT URINALYSIS DIPSTICK
Bilirubin, UA: NEGATIVE
Glucose, UA: NEGATIVE
Ketones, UA: NEGATIVE
Leukocytes, UA: NEGATIVE
Nitrite, UA: NEGATIVE
Protein, UA: NEGATIVE
Spec Grav, UA: >=1.03 — AB (ref 1.010–1.025)
Urobilinogen, UA: 0.2 E.U./dL
pH, UA: 6 (ref 5.0–8.0)

## 2021-03-22 NOTE — Progress Notes (Signed)
Pt scheduled to complete physical 03/31/21.  Reviewed CDC recommendations for importance of HIV/ Hep C screening once in lifetime. Patient has declined HIV / Hep C screenings today and will let us know if they should change their mind in the future./CL,RMA

## 2021-03-23 ENCOUNTER — Other Ambulatory Visit: Payer: Self-pay

## 2021-03-23 DIAGNOSIS — M109 Gout, unspecified: Secondary | ICD-10-CM

## 2021-03-23 LAB — CMP12+LP+TP+TSH+6AC+CBC/D/PLT
ALT: 32 IU/L (ref 0–44)
AST: 28 IU/L (ref 0–40)
Albumin/Globulin Ratio: 2.9 — ABNORMAL HIGH (ref 1.2–2.2)
Albumin: 4.9 g/dL (ref 4.0–5.0)
Alkaline Phosphatase: 65 IU/L (ref 44–121)
BUN/Creatinine Ratio: 11 (ref 9–20)
BUN: 14 mg/dL (ref 6–20)
Basophils Absolute: 0 10*3/uL (ref 0.0–0.2)
Basos: 1 %
Bilirubin Total: 1.6 mg/dL — ABNORMAL HIGH (ref 0.0–1.2)
Calcium: 9.5 mg/dL (ref 8.7–10.2)
Chloride: 103 mmol/L (ref 96–106)
Chol/HDL Ratio: 3.8 ratio (ref 0.0–5.0)
Cholesterol, Total: 173 mg/dL (ref 100–199)
Creatinine, Ser: 1.22 mg/dL (ref 0.76–1.27)
EOS (ABSOLUTE): 0.1 10*3/uL (ref 0.0–0.4)
Eos: 2 %
Estimated CHD Risk: 0.7 times avg. (ref 0.0–1.0)
Free Thyroxine Index: 1.7 (ref 1.2–4.9)
GGT: 23 IU/L (ref 0–65)
Globulin, Total: 1.7 g/dL (ref 1.5–4.5)
Glucose: 89 mg/dL (ref 65–99)
HDL: 45 mg/dL (ref >39)
Hematocrit: 43.5 % (ref 37.5–51.0)
Hemoglobin: 15.6 g/dL (ref 13.0–17.7)
Immature Grans (Abs): 0 10*3/uL (ref 0.0–0.1)
Immature Granulocytes: 0 %
Iron: 178 ug/dL — ABNORMAL HIGH (ref 38–169)
LDH: 250 IU/L — ABNORMAL HIGH (ref 121–224)
LDL Chol Calc (NIH): 105 mg/dL — ABNORMAL HIGH (ref 0–99)
Lymphocytes Absolute: 1.7 10*3/uL (ref 0.7–3.1)
Lymphs: 27 %
MCH: 33.8 pg — ABNORMAL HIGH (ref 26.6–33.0)
MCHC: 35.9 g/dL — ABNORMAL HIGH (ref 31.5–35.7)
MCV: 94 fL (ref 79–97)
Monocytes Absolute: 0.4 10*3/uL (ref 0.1–0.9)
Monocytes: 7 %
Neutrophils Absolute: 3.9 10*3/uL (ref 1.4–7.0)
Neutrophils: 63 %
Phosphorus: 2.8 mg/dL (ref 2.8–4.1)
Platelets: 153 10*3/uL (ref 150–450)
Potassium: 3.9 mmol/L (ref 3.5–5.2)
RBC: 4.62 x10E6/uL (ref 4.14–5.80)
RDW: 13.1 % (ref 11.6–15.4)
Sodium: 141 mmol/L (ref 134–144)
T3 Uptake Ratio: 27 % (ref 24–39)
T4, Total: 6.4 ug/dL (ref 4.5–12.0)
TSH: 1.62 u[IU]/mL (ref 0.450–4.500)
Total Protein: 6.6 g/dL (ref 6.0–8.5)
Triglycerides: 126 mg/dL (ref 0–149)
Uric Acid: 7.7 mg/dL (ref 3.8–8.4)
VLDL Cholesterol Cal: 23 mg/dL (ref 5–40)
WBC: 6.2 10*3/uL (ref 3.4–10.8)
eGFR: 78 mL/min/{1.73_m2} (ref >59)

## 2021-03-23 MED ORDER — ALLOPURINOL 100 MG PO TABS: 100.0000 mg | ORAL_TABLET | Freq: Every day | ORAL | 3 refills | Status: DC

## 2021-03-31 ENCOUNTER — Other Ambulatory Visit: Payer: Self-pay

## 2021-03-31 ENCOUNTER — Encounter: Payer: Self-pay | Admitting: Physician Assistant

## 2021-03-31 ENCOUNTER — Ambulatory Visit: Payer: Self-pay | Admitting: Physician Assistant

## 2021-03-31 VITALS — BP 137/88 | HR 80 | Temp 97.6°F | Resp 14 | Ht 75.0 in | Wt 225.0 lb

## 2021-03-31 DIAGNOSIS — Z0000 Encounter for general adult medical examination without abnormal findings: Secondary | ICD-10-CM

## 2021-03-31 NOTE — Progress Notes (Signed)
   Subjective: Annual physical exam    Patient ID: Johnny Ball, male    DOB: [birth year], 40 y.o.   MRN: [id number]  HPI Patient presents for annual physical exam.  Patient reports no concerns or complaints.   Review of Systems Gout    Objective:   Physical Exam No acute distress.  Temperature 97.6, pulse 81, respiration 14, BP is 145/97, patient 100 O2 sat on room air.  Patient weighs 225 pounds and BMI is 28.1. HEENT is unremarkable.  Neck is supple without adenopathy or bruits.  Lungs are clear to auscultation.  Heart regular rate and rhythm. Abdomen is negative HSM, normoactive bowel sounds, soft, nontender to palpation. No obvious deformity to the upper or lower extremities.  Patient has full and equal range of motion of the upper and lower extremities. No obvious cervical or lumbar spine deformity.  Patient has full and equal range of motion of the cervical lumbar spine. Cranial nerves II through XII are grossly intact.  DTRs are 2+ without clonus.  Well exam.       Assessment & Plan:   Discussed lab results with patient.  Noted there is increase of bilirubin at 1.6, LDH was 250, and iron was elevated 178.  Advised patient we will monitor his labs by repeating again in 6 months.

## 2021-03-31 NOTE — Progress Notes (Signed)
Pt denies any issues or concerns at this time/CL,RMA ?

## 2021-06-09 DIAGNOSIS — R079 Chest pain, unspecified: Secondary | ICD-10-CM | POA: Insufficient documentation

## 2021-06-09 DIAGNOSIS — I491 Atrial premature depolarization: Secondary | ICD-10-CM | POA: Insufficient documentation

## 2021-06-09 DIAGNOSIS — Z8249 Family history of ischemic heart disease and other diseases of the circulatory system: Secondary | ICD-10-CM | POA: Insufficient documentation

## 2021-06-09 DIAGNOSIS — R030 Elevated blood-pressure reading, without diagnosis of hypertension: Secondary | ICD-10-CM | POA: Insufficient documentation

## 2021-08-18 ENCOUNTER — Other Ambulatory Visit: Payer: Self-pay

## 2021-08-18 DIAGNOSIS — Z1152 Encounter for screening for COVID-19: Secondary | ICD-10-CM

## 2021-08-18 LAB — POC COVID19 BINAXNOW: SARS Coronavirus 2 Ag: POSITIVE — AB

## 2021-08-18 NOTE — Progress Notes (Signed)
Pt presents today for covid test. Sore throat, cough, body aches.

## 2021-10-04 ENCOUNTER — Encounter: Payer: Self-pay | Admitting: Physician Assistant

## 2021-10-04 ENCOUNTER — Other Ambulatory Visit: Payer: Self-pay

## 2021-10-04 ENCOUNTER — Ambulatory Visit: Payer: Self-pay | Admitting: Physician Assistant

## 2021-10-04 DIAGNOSIS — R6889 Other general symptoms and signs: Secondary | ICD-10-CM

## 2021-10-04 DIAGNOSIS — J069 Acute upper respiratory infection, unspecified: Secondary | ICD-10-CM

## 2021-10-04 DIAGNOSIS — Z1152 Encounter for screening for COVID-19: Secondary | ICD-10-CM

## 2021-10-04 LAB — POCT INFLUENZA A/B
Influenza A, POC: NEGATIVE
Influenza B, POC: NEGATIVE

## 2021-10-04 MED ORDER — PSEUDOEPH-BROMPHEN-DM 30-2-10 MG/5ML PO SYRP: 5.0000 mL | ORAL_SOLUTION | Freq: Four times a day (QID) | ORAL | 0 refills | Status: DC | PRN

## 2021-10-04 MED ORDER — IBUPROFEN 800 MG PO TABS: 800.0000 mg | ORAL_TABLET | Freq: Three times a day (TID) | ORAL | 0 refills | Status: DC | PRN

## 2021-10-04 NOTE — Progress Notes (Signed)
Fever,congestion, body aches and chills with cough started today but hasnt felt well for 2 weeks. ? ?Covid and flu test is negative today. ?

## 2021-10-04 NOTE — Progress Notes (Signed)
? ?  Subjective: Viral illness  ? ? Patient ID: Johnny Ball, male    DOB: 1980/11/06, 41 y.o.   MRN: [id number] ? ?HPI ?Patient complained of week and a half of viral illness consist of nasal congestion,cough, and body aches.  Patient tested negative for COVID-19 and influenza today. ? ? ?Review of Systems ?Anxiety and elevated blood pressure. ?   ?Objective:  ? Physical Exam ?This is a virtual visit. ? ? ? ?   ?Assessment & Plan: Viral respiratory infection with cough.  ? ?Patient given a prescription for Bromfed-DM and ibuprofen to take as directed.  Follow-up in 48 hours no improvement or worsening complaint. ?

## 2021-10-14 ENCOUNTER — Other Ambulatory Visit: Payer: Self-pay

## 2021-10-14 NOTE — Progress Notes (Signed)
Random UDS & ETOH cleared. ? ?UDS results pending with [facility]. ?

## 2022-04-06 ENCOUNTER — Ambulatory Visit: Payer: 59

## 2022-04-06 VITALS — BP 131/79 | HR 81

## 2022-04-06 DIAGNOSIS — Z0000 Encounter for general adult medical examination without abnormal findings: Secondary | ICD-10-CM

## 2022-04-06 LAB — POCT URINALYSIS DIPSTICK
Bilirubin, UA: NEGATIVE
Blood, UA: NEGATIVE
Glucose, UA: NEGATIVE
Ketones, UA: NEGATIVE
Leukocytes, UA: NEGATIVE
Nitrite, UA: NEGATIVE
Protein, UA: NEGATIVE
Spec Grav, UA: 1.01 (ref 1.010–1.025)
Urobilinogen, UA: 0.2 E.U./dL
pH, UA: 6 (ref 5.0–8.0)

## 2022-04-06 NOTE — Progress Notes (Signed)
Pt presents today for physical labs, will return to clinic for scheduled physical./CL,RMA 

## 2022-04-07 ENCOUNTER — Encounter: Payer: Self-pay | Admitting: Physician Assistant

## 2022-04-07 ENCOUNTER — Encounter: Payer: 59 | Admitting: Physician Assistant

## 2022-04-07 ENCOUNTER — Ambulatory Visit: Payer: Self-pay | Admitting: Physician Assistant

## 2022-04-07 VITALS — BP 140/96 | HR 84 | Temp 97.7°F | Ht 75.0 in | Wt 240.0 lb

## 2022-04-07 DIAGNOSIS — R030 Elevated blood-pressure reading, without diagnosis of hypertension: Secondary | ICD-10-CM

## 2022-04-07 DIAGNOSIS — Z0000 Encounter for general adult medical examination without abnormal findings: Secondary | ICD-10-CM

## 2022-04-07 LAB — CMP12+LP+TP+TSH+6AC+PSA+CBC…
ALT: 43 IU/L (ref 0–44)
AST: 31 IU/L (ref 0–40)
Albumin/Globulin Ratio: 2.8 — ABNORMAL HIGH (ref 1.2–2.2)
Albumin: 5.3 g/dL — ABNORMAL HIGH (ref 4.1–5.1)
Alkaline Phosphatase: 65 IU/L (ref 44–121)
BUN/Creatinine Ratio: 12 (ref 9–20)
BUN: 14 mg/dL (ref 6–20)
Basophils Absolute: 0 10*3/uL (ref 0.0–0.2)
Basos: 0 %
Bilirubin Total: 1.4 mg/dL — ABNORMAL HIGH (ref 0.0–1.2)
Calcium: 9.6 mg/dL (ref 8.7–10.2)
Chloride: 98 mmol/L (ref 96–106)
Chol/HDL Ratio: 4.1 ratio (ref 0.0–5.0)
Cholesterol, Total: 194 mg/dL (ref 100–199)
Creatinine, Ser: 1.2 mg/dL (ref 0.76–1.27)
EOS (ABSOLUTE): 0.1 10*3/uL (ref 0.0–0.4)
Eos: 1 %
Estimated CHD Risk: 0.8 times avg. (ref 0.0–1.0)
Free Thyroxine Index: 1.7 (ref 1.2–4.9)
GGT: 39 IU/L (ref 0–65)
Globulin, Total: 1.9 g/dL (ref 1.5–4.5)
Glucose: 96 mg/dL (ref 70–99)
HDL: 47 mg/dL (ref >39)
Hematocrit: 46.6 % (ref 37.5–51.0)
Hemoglobin: 16.6 g/dL (ref 13.0–17.7)
Immature Grans (Abs): 0 10*3/uL (ref 0.0–0.1)
Immature Granulocytes: 0 %
Iron: 116 ug/dL (ref 38–169)
LDH: 245 IU/L — ABNORMAL HIGH (ref 121–224)
LDL Chol Calc (NIH): 114 mg/dL — ABNORMAL HIGH (ref 0–99)
Lymphocytes Absolute: 1.9 10*3/uL (ref 0.7–3.1)
Lymphs: 27 %
MCH: 34.1 pg — ABNORMAL HIGH (ref 26.6–33.0)
MCHC: 35.6 g/dL (ref 31.5–35.7)
MCV: 96 fL (ref 79–97)
Monocytes Absolute: 0.4 10*3/uL (ref 0.1–0.9)
Monocytes: 6 %
Neutrophils Absolute: 4.5 10*3/uL (ref 1.4–7.0)
Neutrophils: 66 %
Phosphorus: 3.5 mg/dL (ref 2.8–4.1)
Platelets: 141 10*3/uL — ABNORMAL LOW (ref 150–450)
Potassium: 4.1 mmol/L (ref 3.5–5.2)
Prostate Specific Ag, Serum: 1.9 ng/mL (ref 0.0–4.0)
RBC: 4.87 x10E6/uL (ref 4.14–5.80)
RDW: 14.3 % (ref 11.6–15.4)
Sodium: 135 mmol/L (ref 134–144)
T3 Uptake Ratio: 28 % (ref 24–39)
T4, Total: 6.2 ug/dL (ref 4.5–12.0)
TSH: 1.6 u[IU]/mL (ref 0.450–4.500)
Total Protein: 7.2 g/dL (ref 6.0–8.5)
Triglycerides: 188 mg/dL — ABNORMAL HIGH (ref 0–149)
Uric Acid: 8.5 mg/dL — ABNORMAL HIGH (ref 3.8–8.4)
VLDL Cholesterol Cal: 33 mg/dL (ref 5–40)
WBC: 6.9 10*3/uL (ref 3.4–10.8)
eGFR: 79 mL/min/{1.73_m2} (ref >59)

## 2022-04-07 NOTE — Progress Notes (Signed)
[facility] Emergency Department Provider Note  ____________________________________________   None    (approximate)  I have reviewed the triage vital signs and the nursing notes.   HISTORY  Chief Complaint Annual Exam    HPI Johnny Ball is a 41 y.o. male patient presents for annual physical exam.  Patient voiced no concerns or complaints.         No past medical history on file.  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  Prior to Admission medications   Medication Sig Start Date End Date Taking? Authorizing Provider  allopurinol (ZYLOPRIM) 100 MG tablet Take 1 tablet (100 mg total) by mouth daily. 03/23/21  Yes Joni Reining, PA-C  diazepam (VALIUM) 5 MG tablet  08/27/21   [provider]  ibuprofen (ADVIL) 800 MG tablet Take 1 tablet (800 mg total) by mouth every 8 (eight) hours as needed for moderate pain. 10/04/21   Joni Reining, PA-C    Allergies Patient has no known allergies.  No family history on file.  Social History Social History   Tobacco Use   Smoking status: Never   Smokeless tobacco: Never    Review of Systems Constitutional: No fever/chills Eyes: No visual changes. ENT: No sore throat. Cardiovascular: Denies chest pain. Respiratory: Denies shortness of breath. Gastrointestinal: No abdominal pain.  No nausea, no vomiting.  No diarrhea.  No constipation. Genitourinary: Negative for dysuria. Musculoskeletal: Negative for back pain. Skin: Negative for rash. Neurological: Negative for headaches, focal  weakness or numbness. Endocrine: Gout and hypertension  ____________________________________________   PHYSICAL EXAM:  VITAL SIGNS: BP is 143/102 repeat showed 140/96.  Pulse is 84 respirations 97.7.  Patient weighs 240 pounds and BMI is 30.00. Constitutional: Alert and oriented. Well appearing and in no acute distress. Eyes: Conjunctivae are normal. PERRL. EOMI. Head: Atraumatic. Nose: No congestion/rhinnorhea. Mouth/Throat: Mucous membranes are moist.  Oropharynx non-erythematous. Neck: No stridor.   Normal rate, regular rhythm. Grossly normal heart sounds.  Good peripheral circulation. Respiratory: Normal respiratory effort.  No retractions. Lungs CTAB. Gastrointestinal: Soft and nontender. No distention. No abdominal bruits. No CVA tenderness. Genitourinary: Deferred Musculoskeletal: No lower extremity tenderness nor edema.  No joint effusions. Neurologic:  Normal speech and language. No gross focal neurologic deficits are appreciated. No gait instability. Skin:  Skin is warm, dry and intact. No rash noted. Psychiatric: Mood and affect are normal. Speech and behavior are normal.  ____________________________________________   LABS (all labs ordered are listed, but only abnormal results are displayed)  __________         Component Ref Range & Units 1 d ago (04/06/22) 1 yr ago (03/22/21) 1 yr ago (06/03/20) 1 yr ago (05/28/20) 2 yr ago (07/31/19)  Color, UA  Light Yellow  amber  yellow  yellow  yellow   Clarity, UA  Clear  clear  clear  clear  clear   Glucose, UA Negative Negative  Negative  Negative  Positive Abnormal  CM  Negative   Bilirubin, UA  Negative  negative  negative  negative  negative   Ketones, UA  Negative  negative  negative  negative  negative   Spec Grav, UA 1.010 - 1.025 1.010  >=1.030 Abnormal   1.025  1.020  1.010   Blood, UA  Negative  TRACE CM  negative  negative  negative   pH, UA 5.0 - 8.0 6.0  6.0  5.5  5.5  6.0   Protein, UA Negative Negative   Negative  Negative  Negative  Negative   Urobilinogen, UA 0.2 or 1.0 E.U./dL 0.2  0.2  0.2  0.2  0.2   Nitrite, UA  Negative  negative  negative  negative  negative   Leukocytes, UA Negative Negative  Negative  Negative  Negative  Negative   Appearance   dark  dark  medium yellow     Odor                            Other Results from 04/06/2022            Component Ref Range & Units 1 d ago (04/06/22) 1 yr ago (03/22/21) 1 yr ago (05/28/20) 2 yr ago (11/20/19) 2 yr ago (07/31/19)  Glucose 70 - 99 mg/dL 96  89 R  88 R  89 R  82 R   Uric Acid 3.8 - 8.4 mg/dL 8.5 High   7.7 CM  7.5 CM  6.8 CM  8.6 High  CM   Comment:            Therapeutic target for gout patients: <6.0  BUN 6 - 20 mg/dL $Remove'14  14  19  13  14   'sUKMvBs$ Creatinine, Ser 0.76 - 1.27 mg/dL 1.20  1.22  1.25  1.14  1.15   eGFR >59 mL/min/1.73 79  78      BUN/Creatinine Ratio 9 - $R'20 12  11  15  11  12   'YE$ Sodium 134 - 144 mmol/L 135  141  142  139  143   Potassium 3.5 - 5.2 mmol/L 4.1  3.9  4.5  4.3  4.6   Chloride 96 - 106 mmol/L 98  103  104  104  103   Calcium 8.7 - 10.2 mg/dL 9.6  9.5  9.3  9.4  9.7   Phosphorus 2.8 - 4.1 mg/dL 3.5  2.8  4.0  4.2 High   4.2 High    Total Protein 6.0 - 8.5 g/dL 7.2  6.6  7.1  6.8  7.0   Albumin 4.1 - 5.1 g/dL 5.3 High   4.9 R  5.1 High  R  4.6 R  4.7 R   Globulin, Total 1.5 - 4.5 g/dL 1.9  1.7  2.0  2.2  2.3   Albumin/Globulin Ratio 1.2 - 2.2 2.8 High   2.9 High   2.6 High   2.1  2.0   Bilirubin Total 0.0 - 1.2 mg/dL 1.4 High   1.6 High   1.4 High   0.8  1.0   Alkaline Phosphatase 44 - 121 IU/L 65  65  67 CM  72 R  61 R   LDH 121 - 224 IU/L 245 High   250 High   228 High   205  252 High    AST 0 - 40 IU/L $Remov'31  28  21  24  'QsACpA$ 39   ALT 0 - 44 IU/L 43  32  27  24  52 High    GGT 0 - 65 IU/L 39  23  19  20  26   Iron 38 - 169 ug/dL 116  178 High   139  70  110   Cholesterol, Total 100 - 199 mg/dL 194  173  182  170  184   Triglycerides 0 - 149 mg/dL 188 High   126  116  135  190 High    HDL >39 mg/dL  47  45  48  38 Low   39 Low    VLDL Cholesterol Cal 5 - 40 mg/dL 33  $R'23  21  24  'Xm$ 33   LDL Chol Calc (NIH) 0 - 99 mg/dL 114 High   105 High   113 High   108 High   112 High    Chol/HDL Ratio 0.0 - 5.0 ratio 4.1  3.8 CM  3.8 CM  4.5 CM  4.7 CM   Comment:                                   T. Chol/HDL Ratio                                              Men  Women                                1/2 Avg.Risk  3.4    3.3                                    Avg.Risk  5.0    4.4                                 2X Avg.Risk  9.6    7.1                                 3X Avg.Risk 23.4   11.0   Estimated CHD Risk 0.0 - 1.0 times avg. 0.8  0.7 CM  0.7 CM  0.9 CM  1.0 CM   Comment: The CHD Risk is based on the T. Chol/HDL ratio. Other  factors affect CHD Risk such as hypertension, smoking,  diabetes, severe obesity, and family history of  premature CHD.   TSH 0.450 - 4.500 uIU/mL 1.600  1.620  1.300  1.700  1.610   T4, Total 4.5 - 12.0 ug/dL 6.2  6.4  5.3  6.4  6.5   T3 Uptake Ratio 24 - 39 % $Re'28  27  25  26  28   'VJw$ Free Thyroxine Index 1.2 - 4.9 1.7  1.7  1.3  1.7  1.8   Prostate Specific Ag, Serum 0.0 - 4.0 ng/mL 1.9     0.8 CM   Comment: Roche ECLIA methodology.  According to the American Urological Association, Serum PSA should  decrease and remain at undetectable levels after radical  prostatectomy. The AUA defines biochemical recurrence as an initial  PSA value 0.2 ng/mL or greater followed by a subsequent confirmatory  PSA value 0.2 ng/mL or greater.  Values obtained with different assay methods or kits cannot be used  interchangeably. Results cannot be interpreted as absolute evidence  of the presence or absence of malignant disease.   WBC 3.4 - 10.8 x10E3/uL 6.9  6.2  6.1  6.0  5.6   RBC 4.14 - 5.80 x10E6/uL 4.87  4.62  4.87  4.69  4.60   Hemoglobin 13.0 - 17.7 g/dL 16.6  15.6  16.3  15.4  15.4   Hematocrit 37.5 - 51.0 % 46.6  43.5  45.3  44.1  43.8   MCV 79 - 97 fL 96  94  93  94  95   MCH  26.6 - 33.0 pg 34.1 High   33.8 High   33.5 High   32.8  33.5 High    MCHC 31.5 - 35.7 g/dL 35.6  35.9 High   36.0 High   34.9  35.2   RDW 11.6 - 15.4 % 14.3  13.1  14.3  14.1  13.5   Platelets 150 - 450 x10E3/uL 141 Low   153  143 Low   168  148 Low    Neutrophils Not Estab. % 66  63  65  59  57   Lymphs Not Estab. % $Remove'27  27  28  'FfJnqUx$ 32  33   Monocytes Not Estab. % $Remove'6  7  6  6  7   'XNqVyYk$ Eos Not Estab. % $Remove'1  2  1  2  2   'XDpudgF$ Basos Not Estab. % 0  1  0  1  1   Neutrophils Absolute 1.4 - 7.0 x10E3/uL 4.5  3.9  3.9  3.6  3.2   Lymphocytes Absolute 0.7 - 3.1 x10E3/uL 1.9  1.7  1.7  1.9  1.9   Monocytes Absolute 0.1 - 0.9 x10E3/uL 0.4  0.4  0.4  0.3  0.4   EOS (ABSOLUTE) 0.0 - 0.4 x10E3/uL 0.1  0.1  0.1  0.1  0.1   Basophils Absolute 0.0 - 0.2 x10E3/uL 0.0  0.0  0.0  0.0  0.0   Immature Granulocytes Not Estab. % 0  0  0  0  0   Immature Grans           _______________________  EKG  Sinus rhythm at 70 bpm.  Occasional PAC ____________________________________________    ____________________________________________   INITIAL IMPRESSION / ASSESSMENT AND PLAN / ED COURSE  As part of my medical decision making, I reviewed the following data within the electronic MEDICAL RECORD NUMBER      Discussed lab results and elevated blood pressure readings.  Patient amenable to a 3-day blood pressure check.        ____________________________________________   FINAL CLINICAL IMPRESSION(S) / ED DIAGNOSES  Well exam   ED Discharge Orders     None        Note:  This document was prepared using Dragon voice recognition software and may include unintentional dictation errors.

## 2022-04-12 ENCOUNTER — Ambulatory Visit: Payer: 59

## 2022-04-12 VITALS — BP 119/72 | HR 86

## 2022-04-12 DIAGNOSIS — Z0130 Encounter for examination of blood pressure without abnormal findings: Secondary | ICD-10-CM

## 2022-04-12 NOTE — Progress Notes (Signed)
Pt couldn't come for first BP check yesterday.Pt arrived today for 2nd visit but counting as first. Pt is scheduled to have visit with Laray Anger tomorrow.

## 2022-04-13 ENCOUNTER — Encounter: Payer: Self-pay | Admitting: Physician Assistant

## 2022-04-13 ENCOUNTER — Ambulatory Visit: Payer: Self-pay | Admitting: Physician Assistant

## 2022-04-13 VITALS — BP 139/80 | HR 83 | Resp 14

## 2022-04-13 DIAGNOSIS — R030 Elevated blood-pressure reading, without diagnosis of hypertension: Secondary | ICD-10-CM

## 2022-04-13 NOTE — Progress Notes (Signed)
   Subjective:    Patient ID: Johnny Ball, male    DOB: 08-02-1980, 41 y.o.   MRN: [id number]  HPI  Elevated blood pressure patient is here for 3-day blood pressure is reading results.  Patient is fine to be hypertensive on today's physical exam.  No prior history of hypertension.  Patient completed 3-day blood pressure check with average of 130/82.  Patient denies headache vision disturbance or vertigo.  Review of Systems GERD and PA-C    Objective:   Physical Exam BP today is 138/82, pulse 83, respiration 14, temperature 97.4, patient 97% O2 sat on room air.       Assessment & Plan: Elevated blood pressure without diagnosis of hypertension   Follow-up in 3 months.

## 2022-04-13 NOTE — Progress Notes (Signed)
Pt presents today for BP/ Possible medication.

## 2022-04-18 ENCOUNTER — Other Ambulatory Visit: Payer: Self-pay

## 2022-04-18 DIAGNOSIS — M109 Gout, unspecified: Secondary | ICD-10-CM

## 2022-04-19 MED ORDER — ALLOPURINOL 100 MG PO TABS: 100.0000 mg | ORAL_TABLET | Freq: Every day | ORAL | 3 refills | Status: DC

## 2022-07-28 ENCOUNTER — Other Ambulatory Visit: Payer: Self-pay | Admitting: Physician Assistant

## 2022-07-28 ENCOUNTER — Telehealth: Payer: Self-pay

## 2022-07-28 DIAGNOSIS — M109 Gout, unspecified: Secondary | ICD-10-CM

## 2022-07-28 MED ORDER — ALLOPURINOL 100 MG PO TABS: 100.0000 mg | ORAL_TABLET | Freq: Every day | ORAL | 3 refills | Status: DC

## 2022-07-28 MED ORDER — ETODOLAC ER 400 MG PO TB24: 400.0000 mg | ORAL_TABLET | Freq: Every day | ORAL | 1 refills | Status: DC

## 2023-03-21 ENCOUNTER — Ambulatory Visit: Payer: Self-pay

## 2023-03-21 DIAGNOSIS — Z0000 Encounter for general adult medical examination without abnormal findings: Secondary | ICD-10-CM

## 2023-03-21 LAB — POCT URINALYSIS DIPSTICK
Bilirubin, UA: NEGATIVE
Blood, UA: NEGATIVE
Glucose, UA: NEGATIVE
Ketones, UA: NEGATIVE
Leukocytes, UA: NEGATIVE
Nitrite, UA: NEGATIVE
Protein, UA: NEGATIVE
Spec Grav, UA: 1.01 (ref 1.010–1.025)
Urobilinogen, UA: 0.2 E.U./dL
pH, UA: 6 (ref 5.0–8.0)

## 2023-03-21 NOTE — Progress Notes (Signed)
Pt presents today to complete Physical labs.

## 2023-03-22 LAB — CMP12+LP+TP+TSH+6AC+PSA+CBC…
ALT: 38 IU/L (ref 0–44)
AST: 30 IU/L (ref 0–40)
Albumin: 4.8 g/dL (ref 4.1–5.1)
Alkaline Phosphatase: 66 IU/L (ref 44–121)
BUN/Creatinine Ratio: 11 (ref 9–20)
BUN: 13 mg/dL (ref 6–24)
Basophils Absolute: 0 10*3/uL (ref 0.0–0.2)
Basos: 1 %
Bilirubin Total: 1.1 mg/dL (ref 0.0–1.2)
Calcium: 9.4 mg/dL (ref 8.7–10.2)
Chloride: 101 mmol/L (ref 96–106)
Chol/HDL Ratio: 4.9 ratio (ref 0.0–5.0)
Cholesterol, Total: 195 mg/dL (ref 100–199)
Creatinine, Ser: 1.22 mg/dL (ref 0.76–1.27)
EOS (ABSOLUTE): 0.1 10*3/uL (ref 0.0–0.4)
Eos: 1 %
Estimated CHD Risk: 1 times avg. (ref 0.0–1.0)
Free Thyroxine Index: 1.8 (ref 1.2–4.9)
GGT: 34 IU/L (ref 0–65)
Globulin, Total: 2.2 g/dL (ref 1.5–4.5)
Glucose: 88 mg/dL (ref 70–99)
HDL: 40 mg/dL (ref >39)
Hematocrit: 47.6 % (ref 37.5–51.0)
Hemoglobin: 16.3 g/dL (ref 13.0–17.7)
Immature Grans (Abs): 0 10*3/uL (ref 0.0–0.1)
Immature Granulocytes: 0 %
Iron: 100 ug/dL (ref 38–169)
LDH: 234 IU/L — ABNORMAL HIGH (ref 121–224)
LDL Chol Calc (NIH): 115 mg/dL — ABNORMAL HIGH (ref 0–99)
Lymphocytes Absolute: 2.1 10*3/uL (ref 0.7–3.1)
Lymphs: 28 %
MCH: 32.9 pg (ref 26.6–33.0)
MCHC: 34.2 g/dL (ref 31.5–35.7)
MCV: 96 fL (ref 79–97)
Monocytes Absolute: 0.4 10*3/uL (ref 0.1–0.9)
Monocytes: 6 %
Neutrophils Absolute: 4.8 10*3/uL (ref 1.4–7.0)
Neutrophils: 64 %
Phosphorus: 3.3 mg/dL (ref 2.8–4.1)
Platelets: 155 10*3/uL (ref 150–450)
Potassium: 4.2 mmol/L (ref 3.5–5.2)
Prostate Specific Ag, Serum: 1 ng/mL (ref 0.0–4.0)
RBC: 4.96 x10E6/uL (ref 4.14–5.80)
RDW: 14.6 % (ref 11.6–15.4)
Sodium: 140 mmol/L (ref 134–144)
T3 Uptake Ratio: 27 % (ref 24–39)
T4, Total: 6.5 ug/dL (ref 4.5–12.0)
TSH: 1.67 u[IU]/mL (ref 0.450–4.500)
Total Protein: 7 g/dL (ref 6.0–8.5)
Triglycerides: 227 mg/dL — ABNORMAL HIGH (ref 0–149)
Uric Acid: 9.6 mg/dL — ABNORMAL HIGH (ref 3.8–8.4)
VLDL Cholesterol Cal: 40 mg/dL (ref 5–40)
WBC: 7.5 10*3/uL (ref 3.4–10.8)
eGFR: 77 mL/min/{1.73_m2} (ref >59)

## 2023-03-23 ENCOUNTER — Encounter: Payer: Self-pay | Admitting: Physician Assistant

## 2023-03-23 ENCOUNTER — Ambulatory Visit: Payer: Self-pay | Admitting: Physician Assistant

## 2023-03-23 VITALS — BP 140/90 | HR 92 | Temp 97.6°F | Resp 14 | Ht 75.0 in | Wt 235.0 lb

## 2023-03-23 DIAGNOSIS — Z0000 Encounter for general adult medical examination without abnormal findings: Secondary | ICD-10-CM

## 2023-03-23 NOTE — Progress Notes (Signed)
Pt presents today to complete physical, Pt denies any issues or concerns at this time/CL,RMA 

## 2023-03-23 NOTE — Progress Notes (Signed)
[facility] ____________________________________________   None    (approximate)  I have reviewed the triage vital signs and the nursing notes.   HISTORY  Chief Complaint Annual Exam  HPI Johnny Ball is a 42 y.o. male         No past medical history on file.  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  Prior to Admission medications   Medication Sig Start Date End Date Taking? Authorizing Provider  allopurinol (ZYLOPRIM) 100 MG tablet Take 1 tablet (100 mg total) by mouth daily. 07/28/22   Joni Reining, PA-C  etodolac (LODINE XL) 400 MG 24 hr tablet Take 1 tablet (400 mg total) by mouth daily. 07/28/22   Joni Reining, PA-C    Allergies Patient has no known allergies.  No family history on file.  Social History Social History   Tobacco Use   Smoking status: Never   Smokeless tobacco: Never    Review of Systems Constitutional: No fever/chills Eyes: No visual changes. ENT: No sore throat. Cardiovascular: Denies chest pain. Respiratory: Denies shortness of breath. Gastrointestinal: No abdominal pain.  No nausea, no vomiting.  No diarrhea.  No constipation. Genitourinary: Negative for dysuria. Musculoskeletal: Negative for back pain. Skin: Negative for rash. Neurological: Negative for headaches, focal weakness or numbness.   ____________________________________________   PHYSICAL EXAM:  VITAL SIGNS: BP 140/90  Pulse 92  Resp 14  Temp 97.6 F (36.4 C)  Temp src Temporal  SpO2 99 %  Weight 235 lb (106.6 kg)   Height 6\' 3"  (1.905 m)   BMI 29.37 kg/m2  BSA 2.38 m2   Constitutional: Alert and oriented. Well appearing and in no acute distress. Eyes: Conjunctivae are normal. PERRL. EOMI. Head: Atraumatic. Nose: No congestion/rhinnorhea. Mouth/Throat: Mucous membranes are moist.  Oropharynx non-erythematous. Neck: No stridor.  No cervical spine tenderness to palpation. Hematological/Lymphatic/Immunilogical: No cervical lymphadenopathy. Cardiovascular: Normal rate, regular rhythm. Grossly normal heart sounds.  Good peripheral circulation. Respiratory: Normal respiratory effort.  No retractions. Lungs CTAB. Gastrointestinal: Soft and nontender. No distention. No abdominal bruits. No CVA tenderness. Genitourinary: Deferred Musculoskeletal: No lower extremity tenderness nor edema.  No joint effusions. Neurologic:  Normal speech and language. No gross focal neurologic deficits are appreciated. No gait instability. Skin:  Skin is warm, dry and intact. No rash noted. Psychiatric: Mood and affect are normal. Speech and behavior are normal.  ____________________________________________   LABS           Component Ref Range & Units 2 d ago (03/21/23) 11 mo ago (04/06/22) 2 yr ago (03/22/21) 2 yr ago (05/28/20) 3 yr ago (11/20/19) 3 yr ago (07/31/19)  Glucose 70 - 99 mg/dL 88 96 89 R 88 R 89 R 82 R  Uric Acid 3.8 - 8.4 mg/dL 9.6 High  8.5 High  CM 7.7 CM 7.5 CM 6.8 CM 8.6 High  CM  Comment:            Therapeutic target for gout patients: <6.0  BUN 6 - 24 mg/dL 13 14 R 14 R 19 R 13 R 14 R  Creatinine,  Ser 0.76 - 1.27 mg/dL 2.13 0.86 5.78 4.69 6.29 1.15  eGFR >59 mL/min/1.73 77 79 78     BUN/Creatinine Ratio 9 - 20 11 12 11 15 11 12   Sodium 134 - 144 mmol/L 140 135 141 142 139 143  Potassium 3.5 - 5.2 mmol/L 4.2 4.1 3.9 4.5 4.3 4.6  Chloride 96 - 106 mmol/L 101 98 103 104 104 103  Calcium 8.7 - 10.2 mg/dL 9.4 9.6 9.5 9.3 9.4 9.7  Phosphorus 2.8 - 4.1 mg/dL 3.3 3.5 2.8 4.0 4.2 High  4.2  High   Total Protein 6.0 - 8.5 g/dL 7.0 7.2 6.6 7.1 6.8 7.0  Albumin 4.1 - 5.1 g/dL 4.8 5.3 High  4.9 R 5.1 High  R 4.6 R 4.7 R  Globulin, Total 1.5 - 4.5 g/dL 2.2 1.9 1.7 2.0 2.2 2.3  Bilirubin Total 0.0 - 1.2 mg/dL 1.1 1.4 High  1.6 High  1.4 High  0.8 1.0  Alkaline Phosphatase 44 - 121 IU/L 66 65 65 67 CM 72 R 61 R  LDH 121 - 224 IU/L 234 High  245 High  250 High  228 High  205 252 High   AST 0 - 40 IU/L 30 31 28 21 24  39  ALT 0 - 44 IU/L 38 43 32 27 24 52 High   GGT 0 - 65 IU/L 34 39 23 19 20 26   Iron 38 - 169 ug/dL 528 413 244 High  010 70 110  Cholesterol, Total 100 - 199 mg/dL 272 536 644 034 742 595  Triglycerides 0 - 149 mg/dL 638 High  756 High  433 116 135 190 High   HDL >39 mg/dL 40 47 45 48 38 Low  39 Low   VLDL Cholesterol Cal 5 - 40 mg/dL 40 33 23 21 24  33  LDL Chol Calc (NIH) 0 - 99 mg/dL 295 High  188 High  416 High  113 High  108 High  112 High   Chol/HDL Ratio 0.0 - 5.0 ratio 4.9 4.1 CM 3.8 CM 3.8 CM 4.5 CM 4.7 CM  Comment:                                   T. Chol/HDL Ratio                                             Men  Women                               1/2 Avg.Risk  3.4    3.3                                   Avg.Risk  5.0    4.4                                2X Avg.Risk  9.6    7.1                                3X Avg.Risk 23.4   11.0  Estimated  CHD Risk 0.0 - 1.0 times avg. 1.0 0.8 CM 0.7 CM 0.7 CM 0.9 CM 1.0 CM  Comment: The CHD Risk is based on the T. Chol/HDL ratio. Other factors affect CHD Risk such as hypertension, smoking, diabetes, severe obesity, and family history of premature CHD.  TSH 0.450 - 4.500 uIU/mL 1.670 1.600 1.620 1.300 1.700 1.610  T4, Total 4.5 - 12.0 ug/dL 6.5 6.2 6.4 5.3 6.4 6.5  T3 Uptake Ratio 24 - 39 % 27 28 27 25 26 28   Free Thyroxine Index 1.2 - 4.9 1.8 1.7 1.7 1.3 1.7 1.8  Prostate Specific Ag, Serum 0.0 - 4.0 ng/mL 1.0 1.9 CM    0.8 CM  Comment: Roche ECLIA methodology. According to the American  Urological Association, Serum PSA should decrease and remain at undetectable levels after radical prostatectomy. The AUA defines biochemical recurrence as an initial PSA value 0.2 ng/mL or greater followed by a subsequent confirmatory PSA value 0.2 ng/mL or greater. Values obtained with different assay methods or kits cannot be used interchangeably. Results cannot be interpreted as absolute evidence of the presence or absence of malignant disease.  WBC 3.4 - 10.8 x10E3/uL 7.5 6.9 6.2 6.1 6.0 5.6  RBC 4.14 - 5.80 x10E6/uL 4.96 4.87 4.62 4.87 4.69 4.60  Hemoglobin 13.0 - 17.7 g/dL 40.9 81.1 91.4 78.2 95.6 15.4  Hematocrit 37.5 - 51.0 % 47.6 46.6 43.5 45.3 44.1 43.8  MCV 79 - 97 fL 96 96 94 93 94 95  MCH 26.6 - 33.0 pg 32.9 34.1 High  33.8 High  33.5 High  32.8 33.5 High   MCHC 31.5 - 35.7 g/dL 21.3 08.6 57.8 High  46.9 High  34.9 35.2  RDW 11.6 - 15.4 % 14.6 14.3 13.1 14.3 14.1 13.5  Platelets 150 - 450 x10E3/uL 155 141 Low  153 143 Low  168 148 Low   Neutrophils Not Estab. % 64 66 63 65 59 57  Lymphs Not Estab. % 28 27 27 28  32 33  Monocytes Not Estab. % 6 6 7 6 6 7   Eos Not Estab. % 1 1 2 1 2 2   Basos Not Estab. % 1 0 1 0 1 1  Neutrophils Absolute 1.4 - 7.0 x10E3/uL 4.8 4.5 3.9 3.9 3.6 3.2  Lymphocytes Absolute 0.7 - 3.1 x10E3/uL 2.1 1.9 1.7 1.7 1.9 1.9  Monocytes Absolute 0.1 - 0.9 x10E3/uL 0.4 0.4 0.4 0.4 0.3 0.4  EOS (ABSOLUTE) 0.0 - 0.4 x10E3/uL 0.1 0.1 0.1 0.1 0.1 0.1  Basophils Absolute 0.0 - 0.2 x10E3/uL 0.0 0.0 0.0 0.0 0.0 0.0  Immature Granulocytes Not Estab. % 0 0 0 0 0 0  Immature Grans (Abs) 0.0 - 0.1 x10E3/uL 0.0 0.0 0.0 0.0 0.0 0.0            ____________________________________________  EKG  Sinus rhythm at 68 bpm.  Occasional PACs. ____________________________________________    ____________________________________________   INITIAL IMPRESSION / ASSESSMENT AND PLAN  As part of my medical decision making, I reviewed the following data  within the electronic MEDICAL RECORD NUMBER      No acute findings on physical exam and EKG.  Labs reveal elevated triglycerides.  Patient will try lifestyle modification to lower triglycerides.        ____________________________________________   FINAL CLINICAL IMPRESSION( Well exam   ED Discharge Orders     None        Note:  This document was prepared using Dragon voice recognition software and may include unintentional dictation errors.

## 2023-04-24 ENCOUNTER — Other Ambulatory Visit: Payer: Self-pay

## 2023-04-24 DIAGNOSIS — M109 Gout, unspecified: Secondary | ICD-10-CM

## 2023-04-24 MED ORDER — ALLOPURINOL 100 MG PO TABS: 100.0000 mg | ORAL_TABLET | Freq: Every day | ORAL | 3 refills | Status: DC

## 2023-10-09 ENCOUNTER — Encounter: Payer: Self-pay | Admitting: Physician Assistant

## 2023-10-09 ENCOUNTER — Ambulatory Visit: Payer: Self-pay | Admitting: Physician Assistant

## 2023-10-09 VITALS — BP 138/96 | HR 69 | Temp 97.6°F | Resp 16 | Ht 75.0 in | Wt 235.0 lb

## 2023-10-09 DIAGNOSIS — K2100 Gastro-esophageal reflux disease with esophagitis, without bleeding: Secondary | ICD-10-CM

## 2023-10-09 MED ORDER — PANTOPRAZOLE SODIUM 40 MG PO TBEC: 40.0000 mg | DELAYED_RELEASE_TABLET | Freq: Every day | ORAL | 3 refills | Status: DC

## 2023-10-09 NOTE — Progress Notes (Signed)
 OTC Omeprazole everyday for about a year.  Weaned self off x 3-4 days, reflux got worse.  As of now stomach always feeling sour for about a month.

## 2023-10-09 NOTE — Addendum Note (Signed)
 Addended by: Gardner Candle on: 10/09/2023 11:24 AM   Modules accepted: Orders

## 2023-10-09 NOTE — Progress Notes (Signed)
   Subjective:GERD    Patient ID: Johnny Ball, male    DOB: 20-Nov-1980, 43 y.o.   MRN: [id number]  HPI Patient complain of 5+ years of GERD.  Patient states using Omeprazole (OTC).  Patient states he feels his medicine no longer effective.  Patient requested t evaluation by gastroenterologist for definitive evaluation and treatment.   Review of Systems GERD, gout, and PAC.    Objective:   Physical Exam  10/09/2023   10:37 AM    BP 138/96BP. 138/96. Data is abnormal. Taken on 10/09/23 10:37 AM  Pulse Rate 69  Temp 97.6 F (36.4 C)  Temp Source Temporal  Weight 235 lb (106.6 kg)  Height 6\' 3"  (1.905 m)  Resp 16  SpO2 100 %  No acute distress. HEENT is unremarkable. Neck is supple lymphadenopathy or bruits. Lungs are clear to auscultation. Heart regular rate and rhythm. Abdomen with normal active bowel sounds, negative HSM, and no guarding with palpation.       Assessment & Plan: GERD  Consult to gastroenterologist for definitive evaluation and treatment.

## 2023-10-31 ENCOUNTER — Other Ambulatory Visit: Payer: Self-pay

## 2023-10-31 DIAGNOSIS — M109 Gout, unspecified: Secondary | ICD-10-CM

## 2023-10-31 MED ORDER — ETODOLAC ER 400 MG PO TB24
400.0000 mg | ORAL_TABLET | Freq: Every day | ORAL | 1 refills | Status: DC
Start: 2023-10-31 — End: 2024-05-20

## 2024-01-04 ENCOUNTER — Other Ambulatory Visit: Payer: Self-pay

## 2024-01-04 DIAGNOSIS — K2100 Gastro-esophageal reflux disease with esophagitis, without bleeding: Secondary | ICD-10-CM

## 2024-01-04 MED ORDER — PANTOPRAZOLE SODIUM 40 MG PO TBEC
40.0000 mg | DELAYED_RELEASE_TABLET | Freq: Every day | ORAL | 3 refills | Status: DC
Start: 2024-01-04 — End: 2024-05-21

## 2024-01-30 ENCOUNTER — Telehealth: Payer: Self-pay

## 2024-01-30 NOTE — Telephone Encounter (Signed)
 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  Reported BP elevations and having BP checked by medical while he's there.  Stated staying hydrated and trying to rest in [facility] as it's outdoor activities and extreme heat and exercise with [employer].  Default to ED if continue elevated BP.  Has set appt for when returns to [facility] this week.  Noted hx elevated BP readings.  Denies and extreme headache or any other complaint at this time.

## 2024-01-31 NOTE — ED Triage Notes (Signed)
 Pt presented to ED for chest tightness,  dizziness, lightheadedness, and HA. Pt states having hypertension x2 days. No hx of HTN. +nausea. Pt denies SOB, V/D. Pt A&Ox4, ambulatory to triage with steady gait.

## 2024-01-31 NOTE — ED Provider Notes (Signed)
 Initial Provider Assessment  Interpreter used: Level of Interpreter Services: No interpreter needed (no language barrier) Historian: patient  HPI: Pt is a 43 y.o. male with history as listed below who presents to the ED with No chief complaint on file.   Generally healthy 43 y/o has been having episodes of lightheadedness and chest tightness for several months, with an especially bad one this afternoon, now resolved.  No hx heart problems.    There were no vitals filed for this visit. Focused Physical Exam: Gen: No Acute Distress HEENT: Normocephalic and atraumatic CV: Regular rate Lung: No respiratory distress Abd: Soft, non-tender, non-distended Neuro: Alert and awake, moving all extremities well  Medical Decision Making and Plan: Given the patient's initial provider assessment, the following diagnostic evaluation and therapeutic interventions have been ordered. The patient will be placed in the appropriate treatment space, once one is available, to complete the evaluation and treatment.  I have discussed the plan of care with the patient. Prior notes reviewed: no Tests ordered:  Orders Placed This Encounter  Procedures  . ECG 12-lead   Treatments ordered: Medications - No data to display    Lucille Norleen Pac, MD 01/31/24 2222

## 2024-01-31 NOTE — ED Notes (Signed)
 Patient transported to XR.

## 2024-02-01 ENCOUNTER — Ambulatory Visit: Payer: Self-pay

## 2024-02-01 NOTE — ED Notes (Addendum)
 Assumed care of patient. Patient presents to ED for CP. Pt reports blood pressure has been increased over the past few days. Pt endorses CP, dizziness, SOB, and HA. Pt denies these symptoms at this time. Pt denies any cardiac history but reports a significant familial cardiac history. Pt reports that this happened a few years back and he was placed on a cardiac monitor but results showed nothing. Patient is attached to continuous cardiac and pulse ox monitoring. Even and unlabored respirations noted. Equal rise and fall of chest bilaterally. No distress noted. Patient denies needs at this time. Call bell within reach. Visitor at bedside.  No past medical history on file.

## 2024-02-01 NOTE — ED Notes (Signed)
 Rounded on patient, patient resting on stretcher. Respirations even and unlabored. Equal rise and fall of chest bilaterally. Bed remains in low and locked position. Patient denies any needs at this time. Call bell within reach. Visitor remains at bedside.

## 2024-02-01 NOTE — ED Notes (Signed)
 Discharge instructions reviewed with patient. Pt verbalized understanding, questions and concerns addressed. Respirations even and unlabored. Equal and bilateral rise and fall of chest bilaterally. Pt escorted out of department and confirmed having all personal belongings. Vital signs stable at this time.

## 2024-02-01 NOTE — Progress Notes (Signed)
 I performed a history and physical examination of Johnny Ball as documented in the resident/fellow/APP note and discussed his management with:  Treatment Team:  Attending Provider: Cort Asberry Carson, MD Registered Nurse: Delores Rouse, RN Resident: Cullen Forbes Ply, MD   I agree with the history, physical, assessment, and plan of care, with the following exceptions: None  43YO M with gout on allopurinol  here with concern for dizziness and lightheadedness. Associated with headache and chest tightness. Blurry vision with these episodes. 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 His symptoms have resolved. Currently BP is 123 systolic in the room, he is asymptomatic. Has seen primary care for his BP but they did not need to start medications at that time.   Patient reports some of these episodes happened a few weeks ago while [place] the beach.  Generally they improved with rest.  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  No clear trigger for his symptoms.  No nausea or vomiting.  Reports he stays well-hydrated.   Physical Exam:  Vitals:   02/01/24 0410  BP: (!) 125/91  Pulse: 87  Resp: 19  Temp:     Gen: In no acute distress, awake, alert and oriented HEENT: NCAT, conjunctiva clear, moist mucous membranes Neck: Painless and full ROM Lungs: CTA bilaterally CV: Regular rate and rhythm Abdomen: Soft, non-tender, non-distended Msk: No joint swelling or erythema, no pain with movement of joints, no tenderness to palpation Skin: No rash, no erythema Neuro: Moves all extremities well, no focal deficits, strength and sensation grossly intact, normal speech   Lightheadedness, dizziness, mild headache, developed chest tightness when told his BP was high. Troponin rule-out. ECG with no significant findings. Labs with no significant electrolyte derangement. No anemia. Not orthostatic. CXR unremarkable. 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 Will place Primary Care referral.    I was present for the following procedures: None Time Spent in Critical Care of the patient: None Time spent in discussions with the patient and family: 15 minutes  Rebecca Lewen Donohoe

## 2024-02-08 DIAGNOSIS — I10 Essential (primary) hypertension: Secondary | ICD-10-CM | POA: Insufficient documentation

## 2024-02-08 DIAGNOSIS — F411 Generalized anxiety disorder: Secondary | ICD-10-CM | POA: Insufficient documentation

## 2024-02-08 DIAGNOSIS — E782 Mixed hyperlipidemia: Secondary | ICD-10-CM | POA: Insufficient documentation

## 2024-02-15 DIAGNOSIS — R1013 Epigastric pain: Secondary | ICD-10-CM | POA: Insufficient documentation

## 2024-03-29 ENCOUNTER — Ambulatory Visit: Payer: Self-pay

## 2024-03-29 DIAGNOSIS — R1013 Epigastric pain: Secondary | ICD-10-CM | POA: Diagnosis not present

## 2024-03-29 DIAGNOSIS — K219 Gastro-esophageal reflux disease without esophagitis: Secondary | ICD-10-CM | POA: Diagnosis present

## 2024-04-03 DIAGNOSIS — I129 Hypertensive chronic kidney disease with stage 1 through stage 4 chronic kidney disease, or unspecified chronic kidney disease: Secondary | ICD-10-CM | POA: Insufficient documentation

## 2024-04-03 DIAGNOSIS — N182 Chronic kidney disease, stage 2 (mild): Secondary | ICD-10-CM | POA: Insufficient documentation

## 2024-04-08 ENCOUNTER — Other Ambulatory Visit: Payer: Self-pay

## 2024-04-08 DIAGNOSIS — M109 Gout, unspecified: Secondary | ICD-10-CM

## 2024-04-08 MED ORDER — ALLOPURINOL 100 MG PO TABS: 100.0000 mg | ORAL_TABLET | Freq: Every day | ORAL | 3 refills | Status: AC

## 2024-05-01 ENCOUNTER — Other Ambulatory Visit: Payer: Self-pay

## 2024-05-01 DIAGNOSIS — M109 Gout, unspecified: Secondary | ICD-10-CM

## 2024-05-20 ENCOUNTER — Other Ambulatory Visit: Payer: Self-pay

## 2024-05-20 DIAGNOSIS — M109 Gout, unspecified: Secondary | ICD-10-CM

## 2024-05-20 MED ORDER — ETODOLAC ER 400 MG PO TB24: 400.0000 mg | ORAL_TABLET | Freq: Every day | ORAL | 1 refills | Status: AC

## 2024-05-21 ENCOUNTER — Other Ambulatory Visit: Payer: Self-pay

## 2024-05-21 DIAGNOSIS — K2100 Gastro-esophageal reflux disease with esophagitis, without bleeding: Secondary | ICD-10-CM

## 2024-05-21 MED ORDER — PANTOPRAZOLE SODIUM 40 MG PO TBEC: 40.0000 mg | DELAYED_RELEASE_TABLET | Freq: Every day | ORAL | 3 refills | Status: AC

## 2024-05-27 ENCOUNTER — Telehealth: Payer: Self-pay
